# Patient Record
Sex: Female | Born: 1970 | Race: White | Hispanic: No | Marital: Married
Health system: Southern US, Community
[De-identification: ages and names within clinical notes are randomized; demographics above are authoritative.]

## PROBLEM LIST (undated history)

## (undated) HISTORY — PX: BREAST CYST ASPIRATION: SHX578

---

## 2010-03-31 ENCOUNTER — Encounter: Admission: RE | Admit: 2010-03-31 | Discharge: 2010-03-31 | Payer: Self-pay | Admitting: Obstetrics and Gynecology

## 2011-03-02 ENCOUNTER — Other Ambulatory Visit: Payer: Self-pay | Admitting: Obstetrics and Gynecology

## 2011-03-09 ENCOUNTER — Ambulatory Visit
Admission: RE | Admit: 2011-03-09 | Discharge: 2011-03-09 | Disposition: A | Discharge status: Home / Self Care | Payer: BC Managed Care – PPO | Source: Ambulatory Visit | Attending: Obstetrics and Gynecology | Admitting: Obstetrics and Gynecology

## 2011-03-12 ENCOUNTER — Other Ambulatory Visit: Payer: Self-pay | Admitting: Obstetrics and Gynecology

## 2011-03-12 DIAGNOSIS — N6320 Unspecified lump in the left breast, unspecified quadrant: Secondary | ICD-10-CM

## 2011-03-16 ENCOUNTER — Ambulatory Visit
Admission: RE | Admit: 2011-03-16 | Discharge: 2011-03-16 | Disposition: A | Discharge status: Home / Self Care | Payer: BC Managed Care – PPO | Source: Ambulatory Visit | Attending: Obstetrics and Gynecology | Admitting: Obstetrics and Gynecology

## 2011-03-16 DIAGNOSIS — N6320 Unspecified lump in the left breast, unspecified quadrant: Secondary | ICD-10-CM

## 2011-08-27 ENCOUNTER — Other Ambulatory Visit: Payer: Self-pay | Admitting: Obstetrics and Gynecology

## 2011-08-27 DIAGNOSIS — N6009 Solitary cyst of unspecified breast: Secondary | ICD-10-CM

## 2011-09-10 ENCOUNTER — Ambulatory Visit
Admission: RE | Admit: 2011-09-10 | Discharge: 2011-09-10 | Disposition: A | Discharge status: Home / Self Care | Payer: BC Managed Care – PPO | Source: Ambulatory Visit | Attending: Obstetrics and Gynecology | Admitting: Obstetrics and Gynecology

## 2011-09-10 DIAGNOSIS — N6009 Solitary cyst of unspecified breast: Secondary | ICD-10-CM

## 2012-11-10 ENCOUNTER — Other Ambulatory Visit: Payer: Self-pay | Admitting: Obstetrics and Gynecology

## 2012-11-10 DIAGNOSIS — N6323 Unspecified lump in the left breast, lower outer quadrant: Secondary | ICD-10-CM

## 2012-11-13 ENCOUNTER — Ambulatory Visit
Admission: RE | Admit: 2012-11-13 | Discharge: 2012-11-13 | Disposition: A | Discharge status: Home / Self Care | Payer: 59 | Source: Ambulatory Visit | Attending: Obstetrics and Gynecology | Admitting: Obstetrics and Gynecology

## 2012-11-13 DIAGNOSIS — N6323 Unspecified lump in the left breast, lower outer quadrant: Secondary | ICD-10-CM

## 2014-06-07 ENCOUNTER — Other Ambulatory Visit: Payer: Self-pay | Admitting: Obstetrics and Gynecology

## 2014-06-07 DIAGNOSIS — N6002 Solitary cyst of left breast: Secondary | ICD-10-CM

## 2014-06-08 ENCOUNTER — Other Ambulatory Visit: Payer: Self-pay | Admitting: Obstetrics and Gynecology

## 2014-06-08 DIAGNOSIS — N6002 Solitary cyst of left breast: Secondary | ICD-10-CM

## 2014-06-15 ENCOUNTER — Ambulatory Visit
Admission: RE | Admit: 2014-06-15 | Discharge: 2014-06-15 | Disposition: A | Discharge status: Home / Self Care | Payer: 59 | Source: Ambulatory Visit | Attending: Obstetrics and Gynecology | Admitting: Obstetrics and Gynecology

## 2014-06-15 ENCOUNTER — Encounter (INDEPENDENT_AMBULATORY_CARE_PROVIDER_SITE_OTHER): Payer: Self-pay

## 2014-06-15 DIAGNOSIS — N6002 Solitary cyst of left breast: Secondary | ICD-10-CM

## 2015-08-23 ENCOUNTER — Other Ambulatory Visit: Payer: Self-pay | Admitting: Obstetrics and Gynecology

## 2015-08-23 DIAGNOSIS — N63 Unspecified lump in unspecified breast: Secondary | ICD-10-CM

## 2015-08-30 ENCOUNTER — Other Ambulatory Visit: Payer: Self-pay

## 2015-09-07 ENCOUNTER — Ambulatory Visit
Admission: RE | Admit: 2015-09-07 | Discharge: 2015-09-07 | Disposition: A | Discharge status: Home / Self Care | Payer: Managed Care, Other (non HMO) | Source: Ambulatory Visit | Attending: Obstetrics and Gynecology | Admitting: Obstetrics and Gynecology

## 2015-09-07 DIAGNOSIS — N63 Unspecified lump in unspecified breast: Secondary | ICD-10-CM

## 2016-05-08 ENCOUNTER — Ambulatory Visit
Admission: RE | Admit: 2016-05-08 | Discharge: 2016-05-08 | Disposition: A | Discharge status: Home / Self Care | Payer: Managed Care, Other (non HMO) | Source: Ambulatory Visit | Attending: Obstetrics and Gynecology | Admitting: Obstetrics and Gynecology

## 2016-05-08 ENCOUNTER — Other Ambulatory Visit: Payer: Self-pay | Admitting: Obstetrics and Gynecology

## 2016-05-08 DIAGNOSIS — N63 Unspecified lump in unspecified breast: Secondary | ICD-10-CM

## 2017-06-07 ENCOUNTER — Other Ambulatory Visit: Payer: Self-pay | Admitting: Obstetrics and Gynecology

## 2017-06-07 DIAGNOSIS — Z1231 Encounter for screening mammogram for malignant neoplasm of breast: Secondary | ICD-10-CM

## 2017-06-12 ENCOUNTER — Ambulatory Visit
Admission: RE | Admit: 2017-06-12 | Discharge: 2017-06-12 | Disposition: A | Discharge status: Home / Self Care | Payer: 59 | Source: Ambulatory Visit | Attending: Obstetrics and Gynecology | Admitting: Obstetrics and Gynecology

## 2017-06-12 DIAGNOSIS — Z1231 Encounter for screening mammogram for malignant neoplasm of breast: Secondary | ICD-10-CM

## 2018-01-20 ENCOUNTER — Emergency Department (HOSPITAL_COMMUNITY)
Admission: EM | Admit: 2018-01-20 | Discharge: 2018-01-20 | Disposition: A | Discharge status: Home / Self Care | Payer: 59 | Attending: Emergency Medicine | Admitting: Emergency Medicine

## 2018-01-20 ENCOUNTER — Emergency Department (HOSPITAL_COMMUNITY): Payer: 59

## 2018-01-20 ENCOUNTER — Other Ambulatory Visit: Payer: Self-pay

## 2018-01-20 DIAGNOSIS — M5412 Radiculopathy, cervical region: Secondary | ICD-10-CM | POA: Diagnosis not present

## 2018-01-20 DIAGNOSIS — M79602 Pain in left arm: Secondary | ICD-10-CM | POA: Diagnosis present

## 2018-01-20 LAB — CBC
HCT: 40 % (ref 36.0–46.0)
HEMOGLOBIN: 13.2 g/dL (ref 12.0–15.0)
MCH: 29.5 pg (ref 26.0–34.0)
MCHC: 33 g/dL (ref 30.0–36.0)
MCV: 89.5 fL (ref 78.0–100.0)
Platelets: 196 10*3/uL (ref 150–400)
RBC: 4.47 MIL/uL (ref 3.87–5.11)
RDW: 13.5 % (ref 11.5–15.5)
WBC: 3.8 10*3/uL — AB (ref 4.0–10.5)

## 2018-01-20 LAB — BASIC METABOLIC PANEL
ANION GAP: 8 (ref 5–15)
BUN: 13 mg/dL (ref 6–20)
CALCIUM: 9 mg/dL (ref 8.9–10.3)
CHLORIDE: 106 mmol/L (ref 101–111)
CO2: 24 mmol/L (ref 22–32)
Creatinine, Ser: 0.68 mg/dL (ref 0.44–1.00)
GFR calc non Af Amer: >60 mL/min (ref >60)
Glucose, Bld: 108 mg/dL — ABNORMAL HIGH (ref 65–99)
Potassium: 4.3 mmol/L (ref 3.5–5.1)
SODIUM: 138 mmol/L (ref 135–145)

## 2018-01-20 LAB — I-STAT TROPONIN, ED
TROPONIN I, POC: 0 ng/mL (ref 0.00–0.08)
Troponin i, poc: 0 ng/mL (ref 0.00–0.08)

## 2018-01-20 LAB — I-STAT BETA HCG BLOOD, ED (MC, WL, AP ONLY)

## 2018-01-20 NOTE — ED Triage Notes (Signed)
Pt states last night she developed left side arm pain in her elbow that radiates into her shoulder. Pt states this morning she also began to have tightness in her chest. Pt denies any sob.

## 2018-01-20 NOTE — ED Provider Notes (Signed)
[facility] EMERGENCY DEPARTMENT Provider Note   CSN: [id number] Arrival date & time: 01/20/18  0946   History   Chief Complaint Chief Complaint  Patient presents with  . Shoulder Pain    HPI Consuello Lassalle is a 47 y.o. female.  HPI   47 year old female presents today with complaints of left arm pain.  Patient notes that last night she developed some soreness and pain in her left trapezius and arm.  She notes some paresthesias including tingling in the left hand diffusely.  She denies any acute injury but notes she is been exercising.  She notes the symptoms are coming and going, worse with movement of the shoulder.  She denies any acute weakness in the hand, denies any other neurological deficits here today.  Patient denies any personal cardiac history, and there is no cardiac history of her father with MI.  She reports no hypertension, hyperlipidemia, diabetes, smoking, or any other significant risk factors for ACS.  Patient denies any chest pain shortness of breath, diaphoresis here today.  Patient attempted to see her primary care who referred her to the emergency room.    No past medical history on file.  There are no active problems to display for this patient.   Past Surgical History:  Procedure Laterality Date  . BREAST CYST ASPIRATION Left      OB History   None      Home Medications    Prior to Admission medications   Not on File    Family History No family history on file.  Social History Social History   Tobacco Use  . Smoking status: Not on file  Substance Use Topics  . Alcohol use: Not on file  . Drug use: Not on file     Allergies   Ciprofloxacin   Review of Systems Review of Systems  All other systems reviewed and are negative.  Physical Exam Updated Vital Signs BP 115/78 (BP Location: Right Arm)   Pulse 70   Temp 98.6 F (37 C) (Oral)   Resp 20   LMP 12/31/2017 Comment: mirena  SpO2 99%   Physical Exam    Constitutional: She is oriented to person, place, and time. She appears well-developed and well-nourished.  HENT:  Head: Normocephalic and atraumatic.  Eyes: Pupils are equal, round, and reactive to light. Conjunctivae are normal. Right eye exhibits no discharge. Left eye exhibits no discharge. No scleral icterus.  Neck: Normal range of motion. No JVD present. No tracheal deviation present.  Pulmonary/Chest: Effort normal. No stridor.  Neurological: She is alert and oriented to person, place, and time. Coordination normal.  Psychiatric: She has a normal mood and affect. Her behavior is normal. Judgment and thought content normal.  Nursing note and vitals reviewed.   ED Treatments / Results  Labs (all labs ordered are listed, but only abnormal results are displayed) Labs Reviewed  BASIC METABOLIC PANEL - Abnormal; Notable for the following components:      Result Value   Glucose, Bld 108 (*)    All other components within normal limits  CBC - Abnormal; Notable for the following components:   WBC 3.8 (*)    All other components within normal limits  I-STAT TROPONIN, ED  I-STAT BETA HCG BLOOD, ED ([facility], [facility], [facility] ONLY)  I-STAT TROPONIN, ED    EKG EKG Interpretation  Date/Time:  Monday January 20 2018 09:55:59 EDT Ventricular Rate:  81 PR Interval:  146 QRS Duration: 76 QT Interval:  384  QTC Calculation: 446 R Axis:   0 Text Interpretation:  Normal sinus rhythm Low voltage QRS no ST changes. normal. no old comparison Confirmed by Arby BarrettePfeiffer, Marcy [id number]) on 01/20/2018 1:52:09 PM   Radiology Dg Chest 2 View  Result Date: 01/20/2018 CLINICAL DATA:  LEFT-sided chest tightness.  Sweats overnight. EXAM: CHEST - 2 VIEW COMPARISON:  None. FINDINGS: The heart size and mediastinal contours are within normal limits. Both lungs are clear. The visualized skeletal structures are unremarkable. IMPRESSION: No active cardiopulmonary disease. Electronically Signed   By: Elsie StainJohn T Curnes M.D.   On:  01/20/2018 10:38    Procedures Procedures (including critical care time)  Medications Ordered in ED Medications - No data to display   Initial Impression / Assessment and Plan / ED Course  I have reviewed the triage vital signs and the nursing notes.  Pertinent labs & imaging results that were available during my care of the patient were reviewed by me and considered in my medical decision making (see chart for details).     Final Clinical Impressions(s) / ED Diagnoses   Final diagnoses:  Cervical radiculopathy    Labs: I-STAT troponin x2, i-STAT beta-hCG, BMP, CBC  Imaging: DG chest 2 view, ED EKG-no acute abnormalities noted  Consults:  Therapeutics:  Discharge Meds:   Assessment/Plan: 47 year old female presents with paresthesias in her left arm.  She has no signs of ACS dissection or any other intrathoracic abnormality.  She has reassuring workup here with negative troponin reassuring EKG negative chest x-ray.  Her symptoms are reproducible on exam with likely cervical radiculopathy.  Patient will use symptomatic means including ibuprofen, rest, stretching and follow-up as an outpatient if symptoms persist or return to emergency room if she worsens.  Patient without any signs of acute intracranial abnormality including stroke, TIA.  Strict return precautions given, she verbalized understanding and agreement to today's plan had no further questions or concerns at the time of discharge.   ED Discharge Orders    None       Rosalio LoudHedges, Tyyonna Soucy, PA-C 01/20/18 1402    Arby BarrettePfeiffer, Marcy, MD 01/20/18 1810

## 2018-01-20 NOTE — Discharge Instructions (Addendum)
Please read attached information. If you experience any new or worsening signs or symptoms please return to the emergency room for evaluation. Please follow-up with your primary care provider or specialist as discussed.  °

## 2018-01-20 NOTE — ED Notes (Signed)
Pt requesting to leave and just follow up with her PCP, This RN request pt to please try and stay to see a provider.

## 2018-08-02 ENCOUNTER — Other Ambulatory Visit: Payer: Self-pay | Admitting: Family Medicine

## 2018-08-02 DIAGNOSIS — E049 Nontoxic goiter, unspecified: Secondary | ICD-10-CM

## 2018-08-15 ENCOUNTER — Ambulatory Visit
Admission: RE | Admit: 2018-08-15 | Discharge: 2018-08-15 | Disposition: A | Discharge status: Home / Self Care | Payer: 59 | Source: Ambulatory Visit | Attending: Family Medicine | Admitting: Family Medicine

## 2018-08-15 DIAGNOSIS — E049 Nontoxic goiter, unspecified: Secondary | ICD-10-CM

## 2018-08-20 ENCOUNTER — Other Ambulatory Visit: Payer: Self-pay | Admitting: Family Medicine

## 2018-08-20 DIAGNOSIS — E041 Nontoxic single thyroid nodule: Secondary | ICD-10-CM

## 2018-09-09 ENCOUNTER — Ambulatory Visit
Admission: RE | Admit: 2018-09-09 | Discharge: 2018-09-09 | Disposition: A | Discharge status: Home / Self Care | Payer: 59 | Source: Ambulatory Visit | Attending: Family Medicine | Admitting: Family Medicine

## 2018-09-09 ENCOUNTER — Other Ambulatory Visit (HOSPITAL_COMMUNITY)
Admission: RE | Admit: 2018-09-09 | Discharge: 2018-09-09 | Disposition: A | Discharge status: Home / Self Care | Payer: 59 | Source: Ambulatory Visit | Attending: Diagnostic Radiology | Admitting: Diagnostic Radiology

## 2018-09-09 DIAGNOSIS — E041 Nontoxic single thyroid nodule: Secondary | ICD-10-CM

## 2018-10-06 MED FILL — OXYCODONE-ACETAMINOPHEN 5-3: 5-325 | 5 days supply | Qty: 30 | Fill #0

## 2019-04-09 ENCOUNTER — Encounter: Payer: Self-pay | Admitting: Family Medicine

## 2019-10-21 ENCOUNTER — Other Ambulatory Visit: Payer: Self-pay | Admitting: Radiology

## 2019-10-21 DIAGNOSIS — N6320 Unspecified lump in the left breast, unspecified quadrant: Secondary | ICD-10-CM

## 2019-10-29 ENCOUNTER — Ambulatory Visit
Admission: RE | Admit: 2019-10-29 | Discharge: 2019-10-29 | Disposition: A | Discharge status: Home / Self Care | Payer: 59 | Source: Ambulatory Visit | Attending: Radiology | Admitting: Radiology

## 2019-10-29 ENCOUNTER — Ambulatory Visit
Admission: RE | Admit: 2019-10-29 | Discharge: 2019-10-29 | Disposition: A | Discharge status: Home / Self Care | Payer: Managed Care, Other (non HMO) | Source: Ambulatory Visit | Attending: Radiology | Admitting: Radiology

## 2019-10-29 ENCOUNTER — Other Ambulatory Visit: Payer: Self-pay | Admitting: Radiology

## 2019-10-29 ENCOUNTER — Other Ambulatory Visit: Payer: Self-pay

## 2019-10-29 DIAGNOSIS — N6320 Unspecified lump in the left breast, unspecified quadrant: Secondary | ICD-10-CM

## 2019-10-29 DIAGNOSIS — N6310 Unspecified lump in the right breast, unspecified quadrant: Secondary | ICD-10-CM

## 2019-11-13 ENCOUNTER — Other Ambulatory Visit: Payer: Self-pay | Admitting: Radiology

## 2019-11-13 DIAGNOSIS — N6002 Solitary cyst of left breast: Secondary | ICD-10-CM

## 2019-12-04 ENCOUNTER — Other Ambulatory Visit: Payer: Managed Care, Other (non HMO)

## 2019-12-04 ENCOUNTER — Ambulatory Visit
Admission: RE | Admit: 2019-12-04 | Discharge: 2019-12-04 | Disposition: A | Discharge status: Home / Self Care | Payer: Managed Care, Other (non HMO) | Source: Ambulatory Visit | Attending: Radiology | Admitting: Radiology

## 2019-12-04 ENCOUNTER — Other Ambulatory Visit: Payer: Self-pay

## 2019-12-04 DIAGNOSIS — N6002 Solitary cyst of left breast: Secondary | ICD-10-CM

## 2019-12-09 ENCOUNTER — Ambulatory Visit
Admission: RE | Admit: 2019-12-09 | Discharge: 2019-12-09 | Disposition: A | Discharge status: Home / Self Care | Payer: Managed Care, Other (non HMO) | Source: Ambulatory Visit | Attending: Radiology | Admitting: Radiology

## 2019-12-09 ENCOUNTER — Other Ambulatory Visit: Payer: Self-pay

## 2019-12-09 DIAGNOSIS — N6002 Solitary cyst of left breast: Secondary | ICD-10-CM

## 2020-10-08 HISTORY — PX: BREAST BIOPSY: SHX20

## 2021-06-02 ENCOUNTER — Other Ambulatory Visit: Payer: Self-pay | Admitting: Radiology

## 2021-06-02 DIAGNOSIS — N6320 Unspecified lump in the left breast, unspecified quadrant: Secondary | ICD-10-CM

## 2021-06-05 ENCOUNTER — Other Ambulatory Visit: Payer: Self-pay

## 2021-06-05 ENCOUNTER — Other Ambulatory Visit: Payer: Self-pay | Admitting: Radiology

## 2021-06-05 ENCOUNTER — Ambulatory Visit
Admission: RE | Admit: 2021-06-05 | Discharge: 2021-06-05 | Disposition: A | Discharge status: Home / Self Care | Payer: 59 | Source: Ambulatory Visit | Attending: Radiology | Admitting: Radiology

## 2021-06-05 ENCOUNTER — Ambulatory Visit
Admission: RE | Admit: 2021-06-05 | Discharge: 2021-06-05 | Disposition: A | Discharge status: Home / Self Care | Payer: Managed Care, Other (non HMO) | Source: Ambulatory Visit | Attending: Radiology | Admitting: Radiology

## 2021-06-05 DIAGNOSIS — N6320 Unspecified lump in the left breast, unspecified quadrant: Secondary | ICD-10-CM

## 2021-06-09 ENCOUNTER — Other Ambulatory Visit: Payer: Self-pay | Admitting: Radiology

## 2021-06-09 ENCOUNTER — Other Ambulatory Visit: Payer: Managed Care, Other (non HMO)

## 2021-06-09 DIAGNOSIS — R921 Mammographic calcification found on diagnostic imaging of breast: Secondary | ICD-10-CM

## 2021-06-15 ENCOUNTER — Other Ambulatory Visit: Payer: Self-pay

## 2021-06-15 ENCOUNTER — Ambulatory Visit
Admission: RE | Admit: 2021-06-15 | Discharge: 2021-06-15 | Disposition: A | Discharge status: Home / Self Care | Payer: 59 | Source: Ambulatory Visit | Attending: Radiology | Admitting: Radiology

## 2021-06-15 ENCOUNTER — Other Ambulatory Visit: Payer: Self-pay | Admitting: Radiology

## 2021-06-15 DIAGNOSIS — R921 Mammographic calcification found on diagnostic imaging of breast: Secondary | ICD-10-CM

## 2021-12-27 DIAGNOSIS — H02831 Dermatochalasis of right upper eyelid: Secondary | ICD-10-CM | POA: Diagnosis not present

## 2021-12-27 DIAGNOSIS — H0289 Other specified disorders of eyelid: Secondary | ICD-10-CM | POA: Diagnosis not present

## 2021-12-27 DIAGNOSIS — H02834 Dermatochalasis of left upper eyelid: Secondary | ICD-10-CM | POA: Diagnosis not present

## 2022-01-04 DIAGNOSIS — F411 Generalized anxiety disorder: Secondary | ICD-10-CM | POA: Diagnosis not present

## 2022-01-04 DIAGNOSIS — F429 Obsessive-compulsive disorder, unspecified: Secondary | ICD-10-CM | POA: Diagnosis not present

## 2022-01-04 DIAGNOSIS — F331 Major depressive disorder, recurrent, moderate: Secondary | ICD-10-CM | POA: Diagnosis not present

## 2022-01-04 DIAGNOSIS — G4700 Insomnia, unspecified: Secondary | ICD-10-CM | POA: Diagnosis not present

## 2022-01-11 DIAGNOSIS — G4700 Insomnia, unspecified: Secondary | ICD-10-CM | POA: Diagnosis not present

## 2022-01-11 DIAGNOSIS — F411 Generalized anxiety disorder: Secondary | ICD-10-CM | POA: Diagnosis not present

## 2022-01-11 DIAGNOSIS — F331 Major depressive disorder, recurrent, moderate: Secondary | ICD-10-CM | POA: Diagnosis not present

## 2022-02-01 DIAGNOSIS — F331 Major depressive disorder, recurrent, moderate: Secondary | ICD-10-CM | POA: Diagnosis not present

## 2022-02-01 DIAGNOSIS — R42 Dizziness and giddiness: Secondary | ICD-10-CM | POA: Diagnosis not present

## 2022-02-01 DIAGNOSIS — F411 Generalized anxiety disorder: Secondary | ICD-10-CM | POA: Diagnosis not present

## 2022-02-01 DIAGNOSIS — G4700 Insomnia, unspecified: Secondary | ICD-10-CM | POA: Diagnosis not present

## 2022-02-06 DIAGNOSIS — Z01419 Encounter for gynecological examination (general) (routine) without abnormal findings: Secondary | ICD-10-CM | POA: Diagnosis not present

## 2022-02-06 DIAGNOSIS — Z681 Body mass index (BMI) 19 or less, adult: Secondary | ICD-10-CM | POA: Diagnosis not present

## 2022-02-06 DIAGNOSIS — N912 Amenorrhea, unspecified: Secondary | ICD-10-CM | POA: Diagnosis not present

## 2022-02-27 DIAGNOSIS — F331 Major depressive disorder, recurrent, moderate: Secondary | ICD-10-CM | POA: Diagnosis not present

## 2022-02-27 DIAGNOSIS — F411 Generalized anxiety disorder: Secondary | ICD-10-CM | POA: Diagnosis not present

## 2022-02-27 DIAGNOSIS — G4700 Insomnia, unspecified: Secondary | ICD-10-CM | POA: Diagnosis not present

## 2022-03-16 DIAGNOSIS — G4700 Insomnia, unspecified: Secondary | ICD-10-CM | POA: Diagnosis not present

## 2022-03-16 DIAGNOSIS — F429 Obsessive-compulsive disorder, unspecified: Secondary | ICD-10-CM | POA: Diagnosis not present

## 2022-03-16 DIAGNOSIS — F411 Generalized anxiety disorder: Secondary | ICD-10-CM | POA: Diagnosis not present

## 2022-03-16 DIAGNOSIS — F331 Major depressive disorder, recurrent, moderate: Secondary | ICD-10-CM | POA: Diagnosis not present

## 2022-03-28 DIAGNOSIS — F411 Generalized anxiety disorder: Secondary | ICD-10-CM | POA: Diagnosis not present

## 2022-03-28 DIAGNOSIS — F988 Other specified behavioral and emotional disorders with onset usually occurring in childhood and adolescence: Secondary | ICD-10-CM | POA: Diagnosis not present

## 2022-03-28 DIAGNOSIS — G4700 Insomnia, unspecified: Secondary | ICD-10-CM | POA: Diagnosis not present

## 2022-03-28 DIAGNOSIS — F331 Major depressive disorder, recurrent, moderate: Secondary | ICD-10-CM | POA: Diagnosis not present

## 2022-04-19 DIAGNOSIS — F331 Major depressive disorder, recurrent, moderate: Secondary | ICD-10-CM | POA: Diagnosis not present

## 2022-04-19 DIAGNOSIS — G4700 Insomnia, unspecified: Secondary | ICD-10-CM | POA: Diagnosis not present

## 2022-04-19 DIAGNOSIS — F988 Other specified behavioral and emotional disorders with onset usually occurring in childhood and adolescence: Secondary | ICD-10-CM | POA: Diagnosis not present

## 2022-04-19 DIAGNOSIS — F411 Generalized anxiety disorder: Secondary | ICD-10-CM | POA: Diagnosis not present

## 2022-05-03 DIAGNOSIS — F331 Major depressive disorder, recurrent, moderate: Secondary | ICD-10-CM | POA: Diagnosis not present

## 2022-05-03 DIAGNOSIS — F411 Generalized anxiety disorder: Secondary | ICD-10-CM | POA: Diagnosis not present

## 2022-05-03 DIAGNOSIS — F988 Other specified behavioral and emotional disorders with onset usually occurring in childhood and adolescence: Secondary | ICD-10-CM | POA: Diagnosis not present

## 2022-05-03 DIAGNOSIS — G4700 Insomnia, unspecified: Secondary | ICD-10-CM | POA: Diagnosis not present

## 2022-05-24 DIAGNOSIS — F988 Other specified behavioral and emotional disorders with onset usually occurring in childhood and adolescence: Secondary | ICD-10-CM | POA: Diagnosis not present

## 2022-06-06 DIAGNOSIS — R42 Dizziness and giddiness: Secondary | ICD-10-CM | POA: Diagnosis not present

## 2022-06-06 DIAGNOSIS — F411 Generalized anxiety disorder: Secondary | ICD-10-CM | POA: Diagnosis not present

## 2022-06-06 DIAGNOSIS — F988 Other specified behavioral and emotional disorders with onset usually occurring in childhood and adolescence: Secondary | ICD-10-CM | POA: Diagnosis not present

## 2022-06-26 DIAGNOSIS — R140 Abdominal distension (gaseous): Secondary | ICD-10-CM | POA: Diagnosis not present

## 2022-06-26 DIAGNOSIS — Z30432 Encounter for removal of intrauterine contraceptive device: Secondary | ICD-10-CM | POA: Diagnosis not present

## 2022-06-26 DIAGNOSIS — R102 Pelvic and perineal pain: Secondary | ICD-10-CM | POA: Diagnosis not present

## 2022-06-26 DIAGNOSIS — N950 Postmenopausal bleeding: Secondary | ICD-10-CM | POA: Diagnosis not present

## 2022-06-28 DIAGNOSIS — G4700 Insomnia, unspecified: Secondary | ICD-10-CM | POA: Diagnosis not present

## 2022-06-28 DIAGNOSIS — F988 Other specified behavioral and emotional disorders with onset usually occurring in childhood and adolescence: Secondary | ICD-10-CM | POA: Diagnosis not present

## 2022-06-28 DIAGNOSIS — Z23 Encounter for immunization: Secondary | ICD-10-CM | POA: Diagnosis not present

## 2022-06-28 DIAGNOSIS — F411 Generalized anxiety disorder: Secondary | ICD-10-CM | POA: Diagnosis not present

## 2022-06-28 DIAGNOSIS — F331 Major depressive disorder, recurrent, moderate: Secondary | ICD-10-CM | POA: Diagnosis not present

## 2022-07-16 DIAGNOSIS — R140 Abdominal distension (gaseous): Secondary | ICD-10-CM | POA: Diagnosis not present

## 2022-07-20 DIAGNOSIS — M5451 Vertebrogenic low back pain: Secondary | ICD-10-CM | POA: Diagnosis not present

## 2022-09-04 DIAGNOSIS — Z681 Body mass index (BMI) 19 or less, adult: Secondary | ICD-10-CM | POA: Diagnosis not present

## 2022-09-04 DIAGNOSIS — F331 Major depressive disorder, recurrent, moderate: Secondary | ICD-10-CM | POA: Diagnosis not present

## 2022-09-04 DIAGNOSIS — L821 Other seborrheic keratosis: Secondary | ICD-10-CM | POA: Diagnosis not present

## 2022-09-04 DIAGNOSIS — L918 Other hypertrophic disorders of the skin: Secondary | ICD-10-CM | POA: Diagnosis not present

## 2022-09-04 DIAGNOSIS — F411 Generalized anxiety disorder: Secondary | ICD-10-CM | POA: Diagnosis not present

## 2022-09-04 DIAGNOSIS — G4700 Insomnia, unspecified: Secondary | ICD-10-CM | POA: Diagnosis not present

## 2022-09-04 DIAGNOSIS — D1801 Hemangioma of skin and subcutaneous tissue: Secondary | ICD-10-CM | POA: Diagnosis not present

## 2022-09-24 DIAGNOSIS — Z13220 Encounter for screening for lipoid disorders: Secondary | ICD-10-CM | POA: Diagnosis not present

## 2022-09-24 DIAGNOSIS — Z0000 Encounter for general adult medical examination without abnormal findings: Secondary | ICD-10-CM | POA: Diagnosis not present

## 2022-09-24 DIAGNOSIS — Z114 Encounter for screening for human immunodeficiency virus [HIV]: Secondary | ICD-10-CM | POA: Diagnosis not present

## 2022-09-27 DIAGNOSIS — Z0000 Encounter for general adult medical examination without abnormal findings: Secondary | ICD-10-CM | POA: Diagnosis not present

## 2023-02-08 DIAGNOSIS — Z6820 Body mass index (BMI) 20.0-20.9, adult: Secondary | ICD-10-CM | POA: Diagnosis not present

## 2023-02-08 DIAGNOSIS — Z01419 Encounter for gynecological examination (general) (routine) without abnormal findings: Secondary | ICD-10-CM | POA: Diagnosis not present

## 2023-02-08 DIAGNOSIS — N951 Menopausal and female climacteric states: Secondary | ICD-10-CM | POA: Diagnosis not present

## 2023-02-08 DIAGNOSIS — R6882 Decreased libido: Secondary | ICD-10-CM | POA: Diagnosis not present

## 2023-02-21 ENCOUNTER — Ambulatory Visit
Admission: RE | Admit: 2023-02-21 | Discharge: 2023-02-21 | Disposition: A | Discharge status: Home / Self Care | Payer: BC Managed Care – PPO | Source: Ambulatory Visit | Attending: Obstetrics and Gynecology | Admitting: Obstetrics and Gynecology

## 2023-02-21 ENCOUNTER — Other Ambulatory Visit: Payer: Self-pay | Admitting: Obstetrics and Gynecology

## 2023-02-21 DIAGNOSIS — Z1231 Encounter for screening mammogram for malignant neoplasm of breast: Secondary | ICD-10-CM | POA: Diagnosis not present

## 2023-02-28 DIAGNOSIS — R6882 Decreased libido: Secondary | ICD-10-CM | POA: Diagnosis not present

## 2023-04-28 IMAGING — MG MM BREAST BX W/ LOC DEV 1ST LESION IMAGE BX SPEC STEREO GUIDE*R*
8 of 12 series · 8 of 28 positions shown · non-contrast
Comparison: Previous exams.
COMPARISON: Previous exams.

Addendum:
CLINICAL DATA: Patient presents for stereotactic guided core biopsy
of RIGHT breast calcifications.

EXAM:
RIGHT BREAST STEREOTACTIC CORE NEEDLE BIOPSY

[R (1 of 6)]
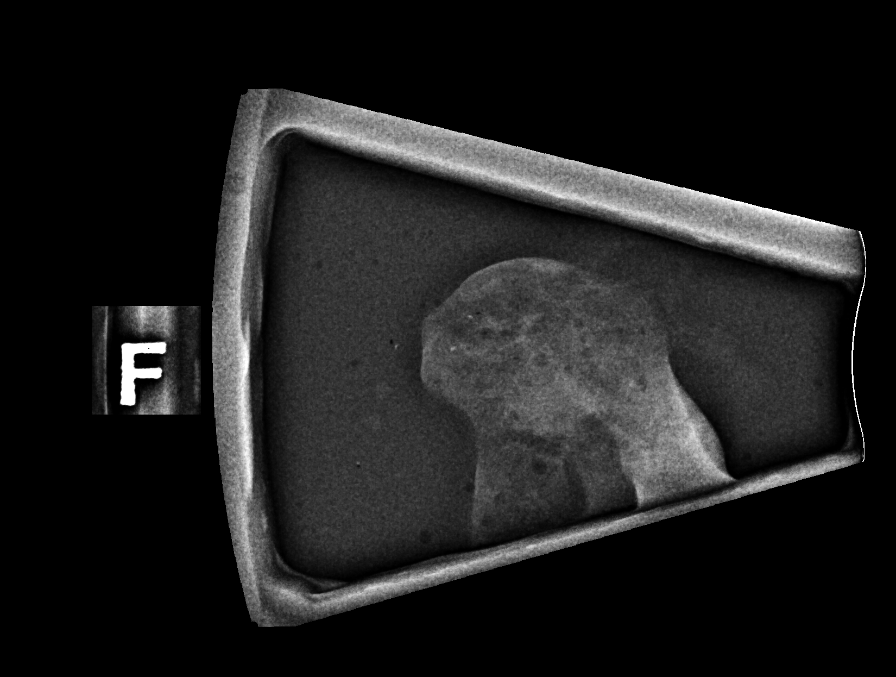

[R (2 of 6)]
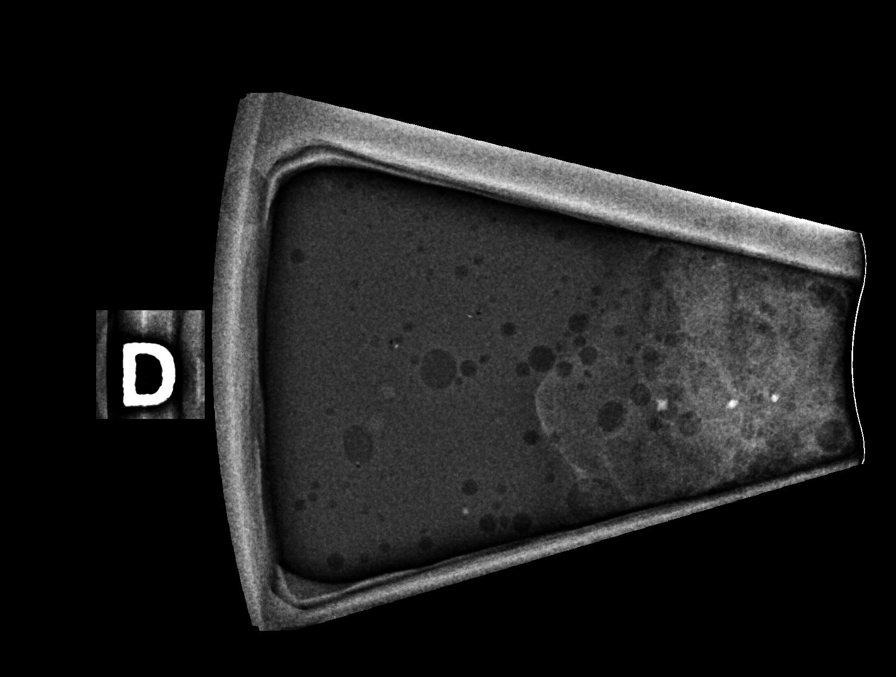

[R (3 of 6)]
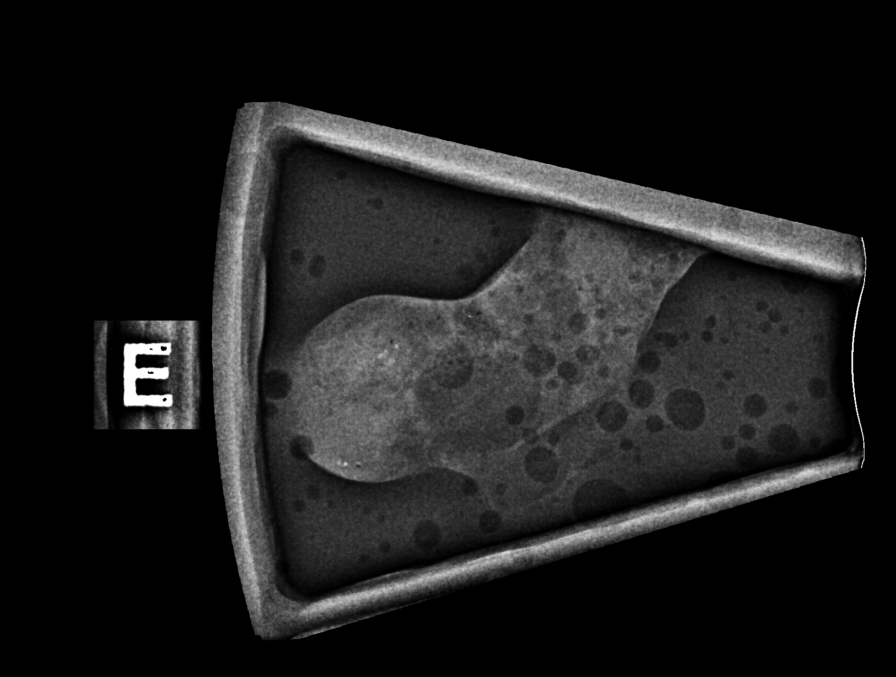

[R (4 of 6)]
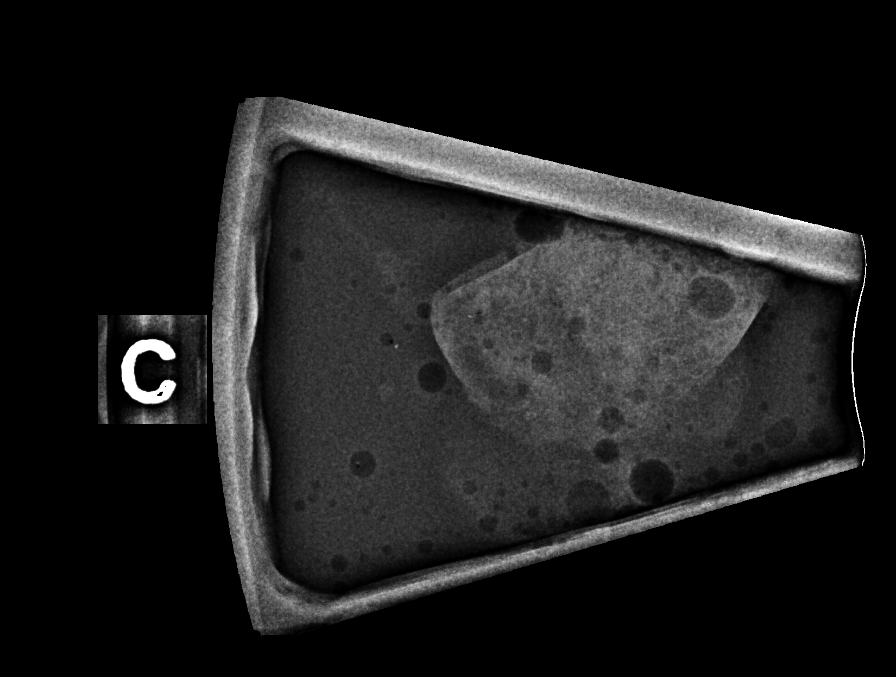

[R (5 of 6)]
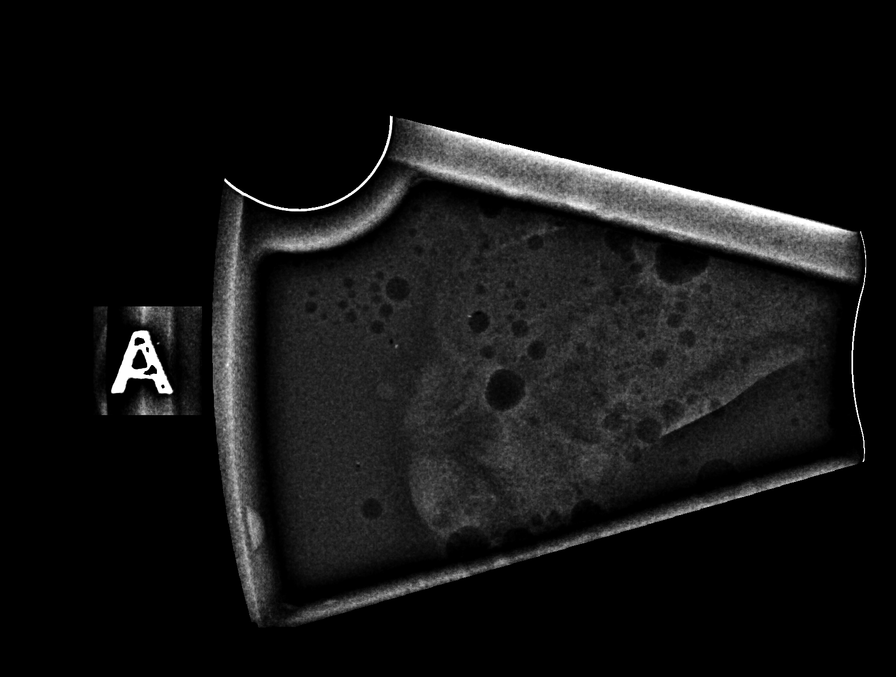

[R (6 of 6)]
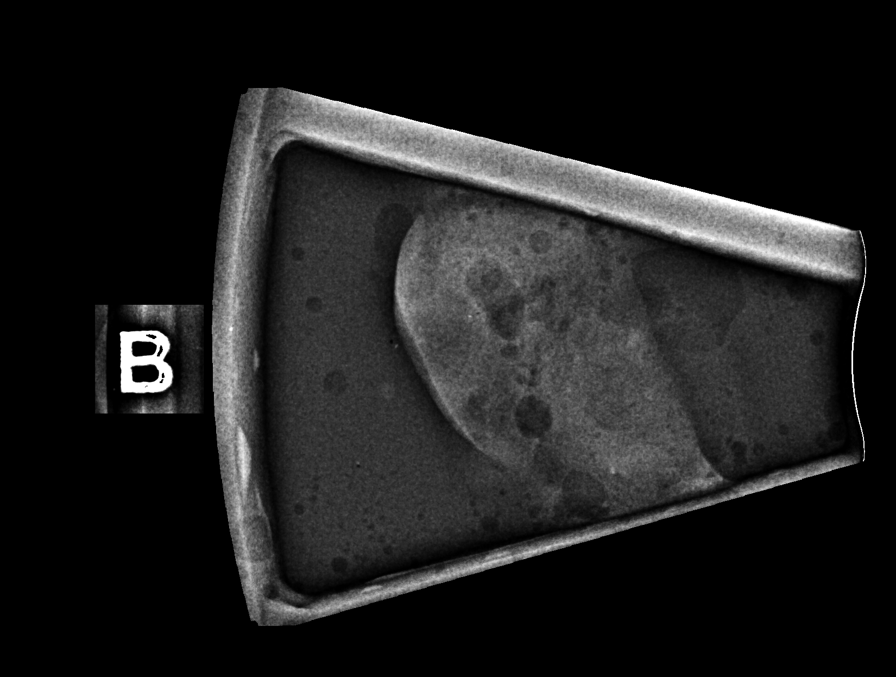

[R LM (1 of 2)]
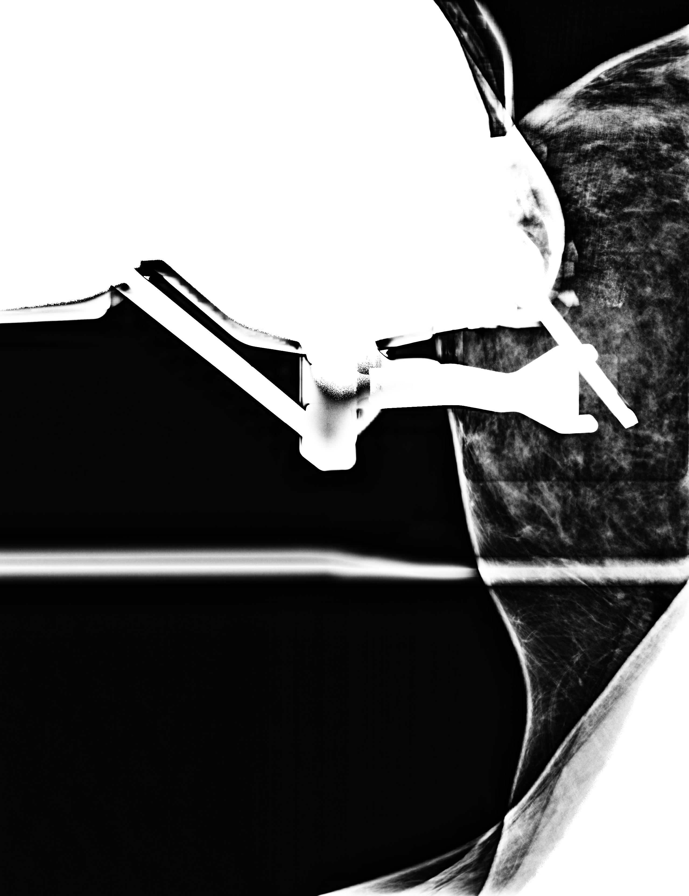

[R LM (2 of 2)]
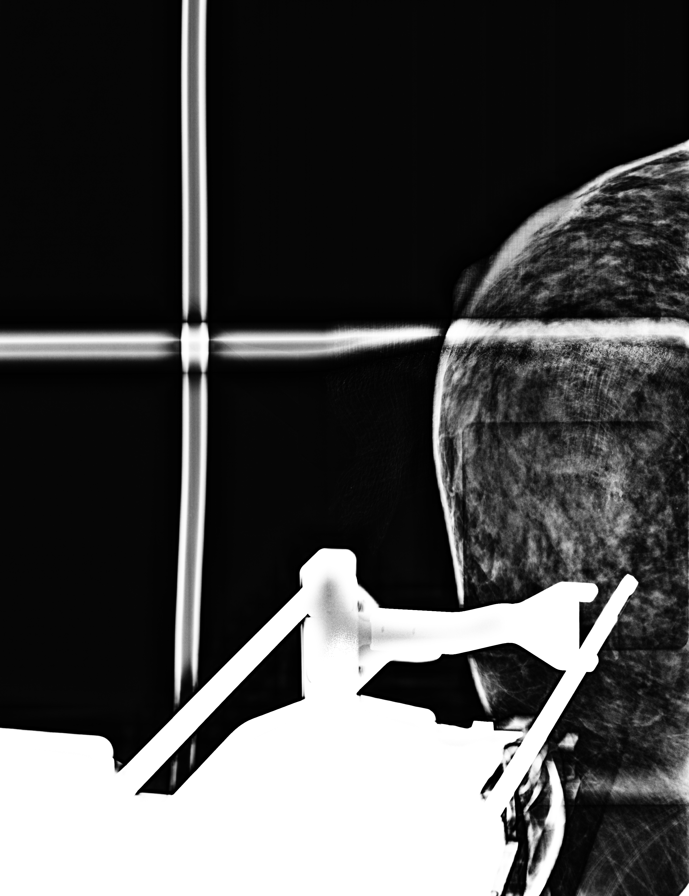

[8 of 28 positions shown; findings below may reference images not displayed]



Using sterile technique and 1% Lidocaine as local anesthetic, under
stereotactic guidance, a 9 gauge vacuum assisted device was used to
perform core needle biopsy of calcifications in the UPPER-OUTER
QUADRANT of the RIGHT breast using a LATERAL to MEDIAL approach.
Specimen radiograph was performed showing calcifications in numerous
tissue samples. Specimens with calcifications are identified for
pathology.

Lesion quadrant: UPPER-OUTER QUADRANT RIGHT

At the conclusion of the procedure, an X shaped tissue marker clip
was deployed into the biopsy cavity. Follow-up 2-view mammogram was
performed and dictated separately.
IMPRESSION: Stereotactic-guided biopsy of RIGHT breast calcifications. No
apparent complications.

ADDENDUM:
Pathology revealed SCLEROSING ADENOSIS AND FIBROCYSTIC CHANGES WITH
APOCRINE METAPLASIA, CALCIFICATIONS of the RIGHT breast, upper
outer, (x clip). This was found to be concordant by Dr. Gizellaa
Asrina.

Pathology results were discussed with the patient by telephone. The
patient reported doing well after the biopsy with tenderness and
bleeding at the site. Post biopsy instructions and care were
reviewed and questions were answered. The patient was encouraged to
call The [REDACTED] for any additional
concerns.

The patient was instructed to return for annual screening
mammography in May 2022.

Pathology results reported by Daniele Bp, RN on 06/19/2021.



Using sterile technique and 1% Lidocaine as local anesthetic, under
stereotactic guidance, a 9 gauge vacuum assisted device was used to
perform core needle biopsy of calcifications in the UPPER-OUTER
QUADRANT of the RIGHT breast using a LATERAL to MEDIAL approach.
Specimen radiograph was performed showing calcifications in numerous
tissue samples. Specimens with calcifications are identified for
pathology.

Lesion quadrant: UPPER-OUTER QUADRANT RIGHT

At the conclusion of the procedure, an X shaped tissue marker clip
was deployed into the biopsy cavity. Follow-up 2-view mammogram was
performed and dictated separately.
IMPRESSION: Stereotactic-guided biopsy of RIGHT breast calcifications. No
apparent complications.

## 2023-04-28 IMAGING — MG MM BREAST LOCALIZATION CLIP
4 series · 4 of 12 positions shown · non-contrast
Comparison: Previous exam(s).

CLINICAL DATA: Status post stereotactic guided core biopsy of
calcifications in the UPPER QUADRANT of the RIGHT breast.

EXAM:
3D DIAGNOSTIC RIGHT MAMMOGRAM POST STEREOTACTIC BIOPSY

[R LM synth-2D]
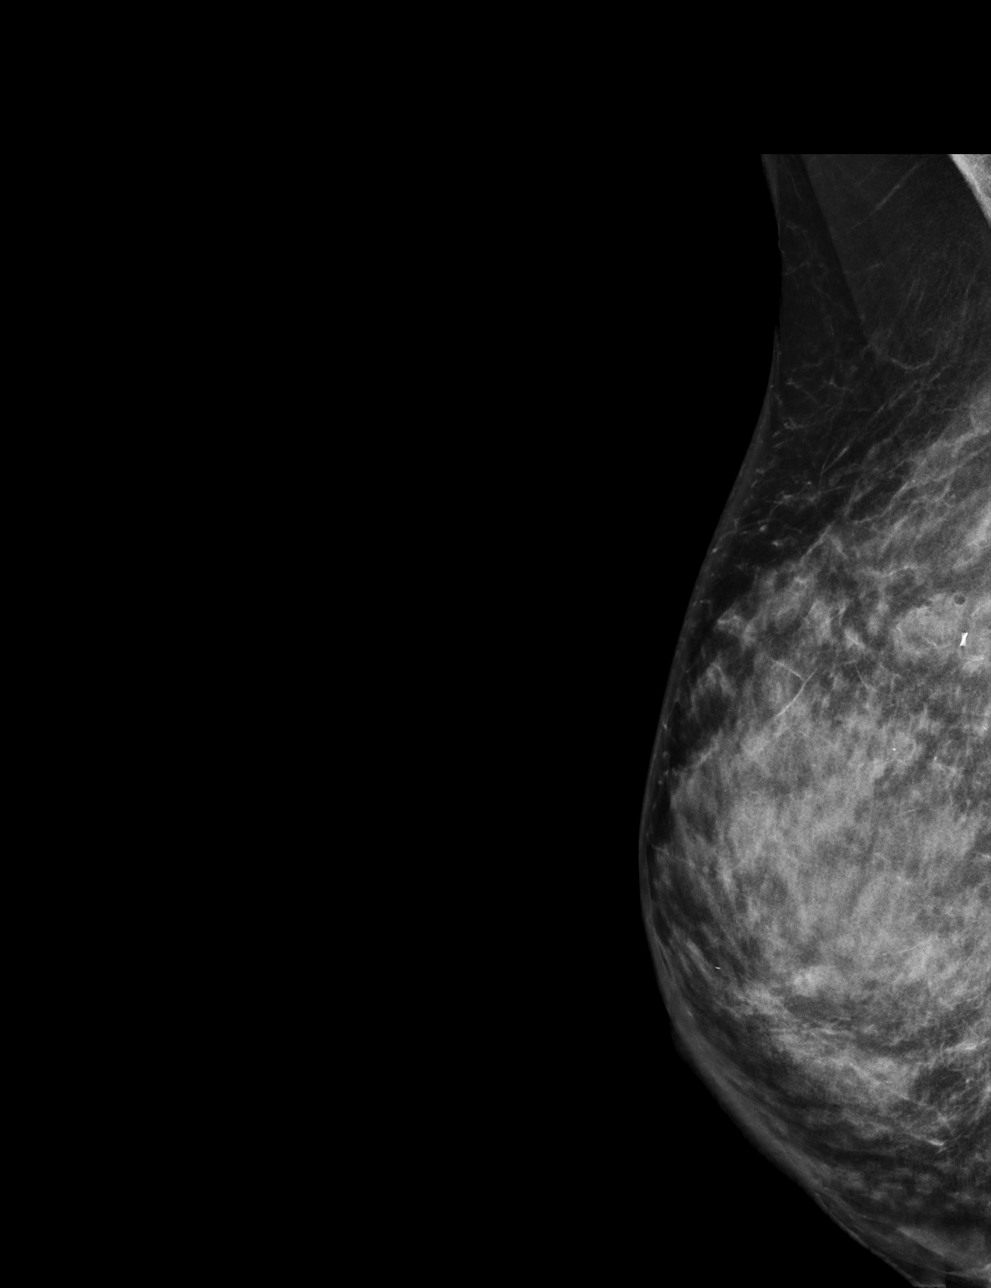

[R CC synth-2D]
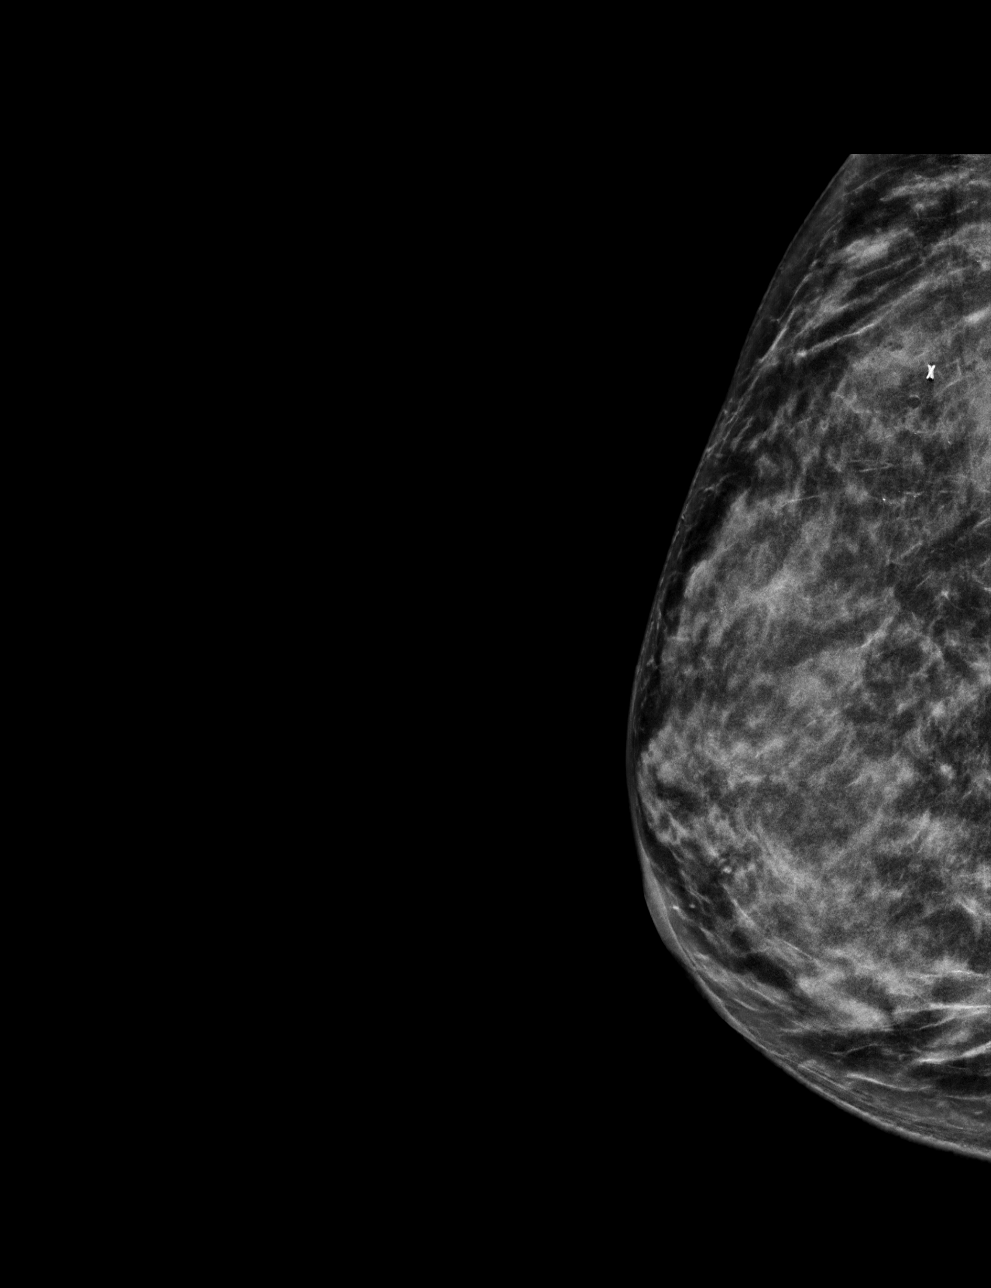

[R CC tomo · tomo slice 29/57.0]
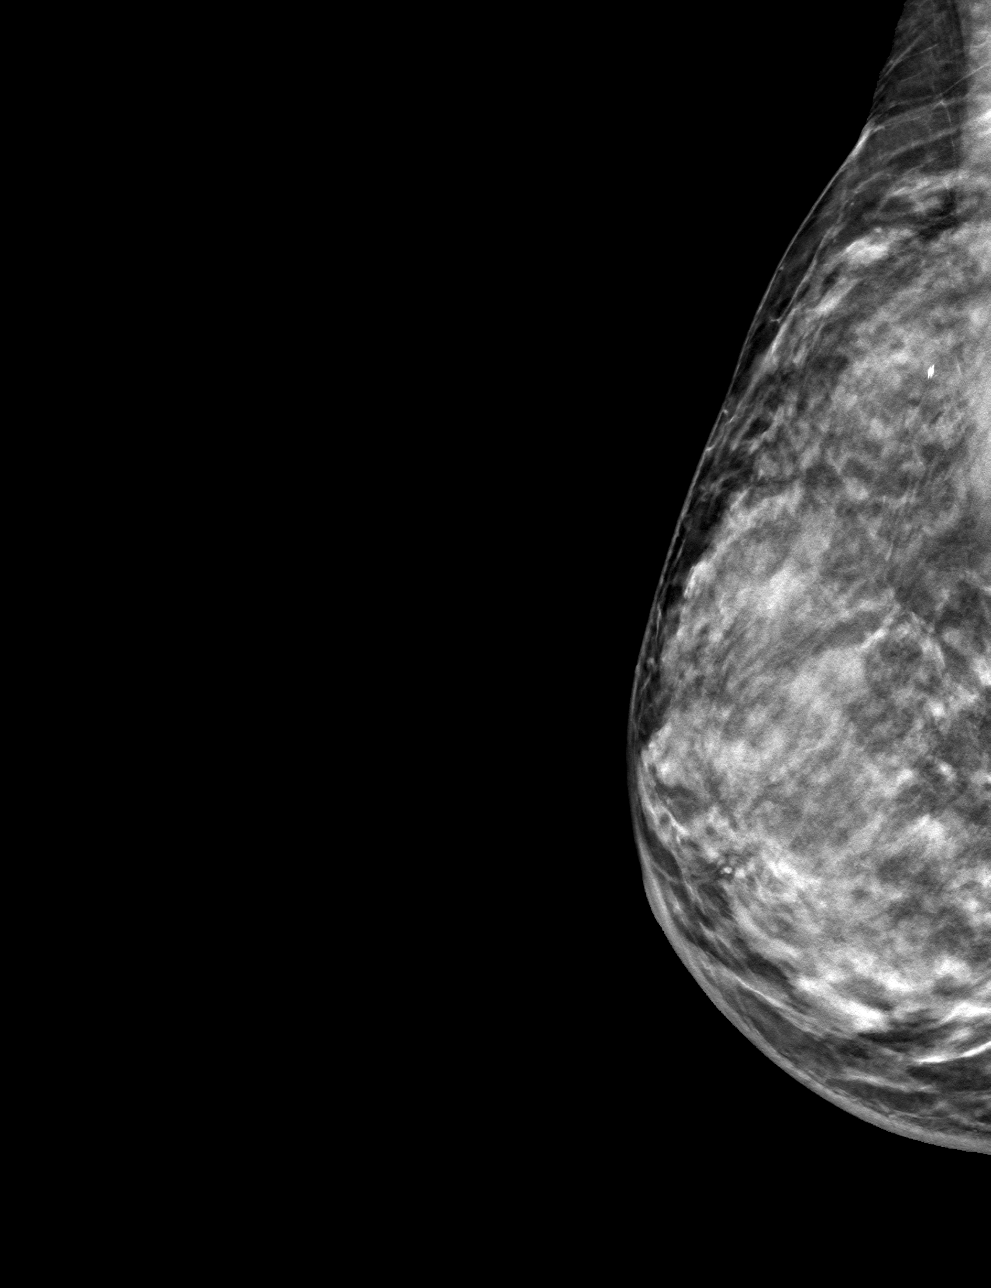

[R LM tomo · tomo slice 32/63.0]
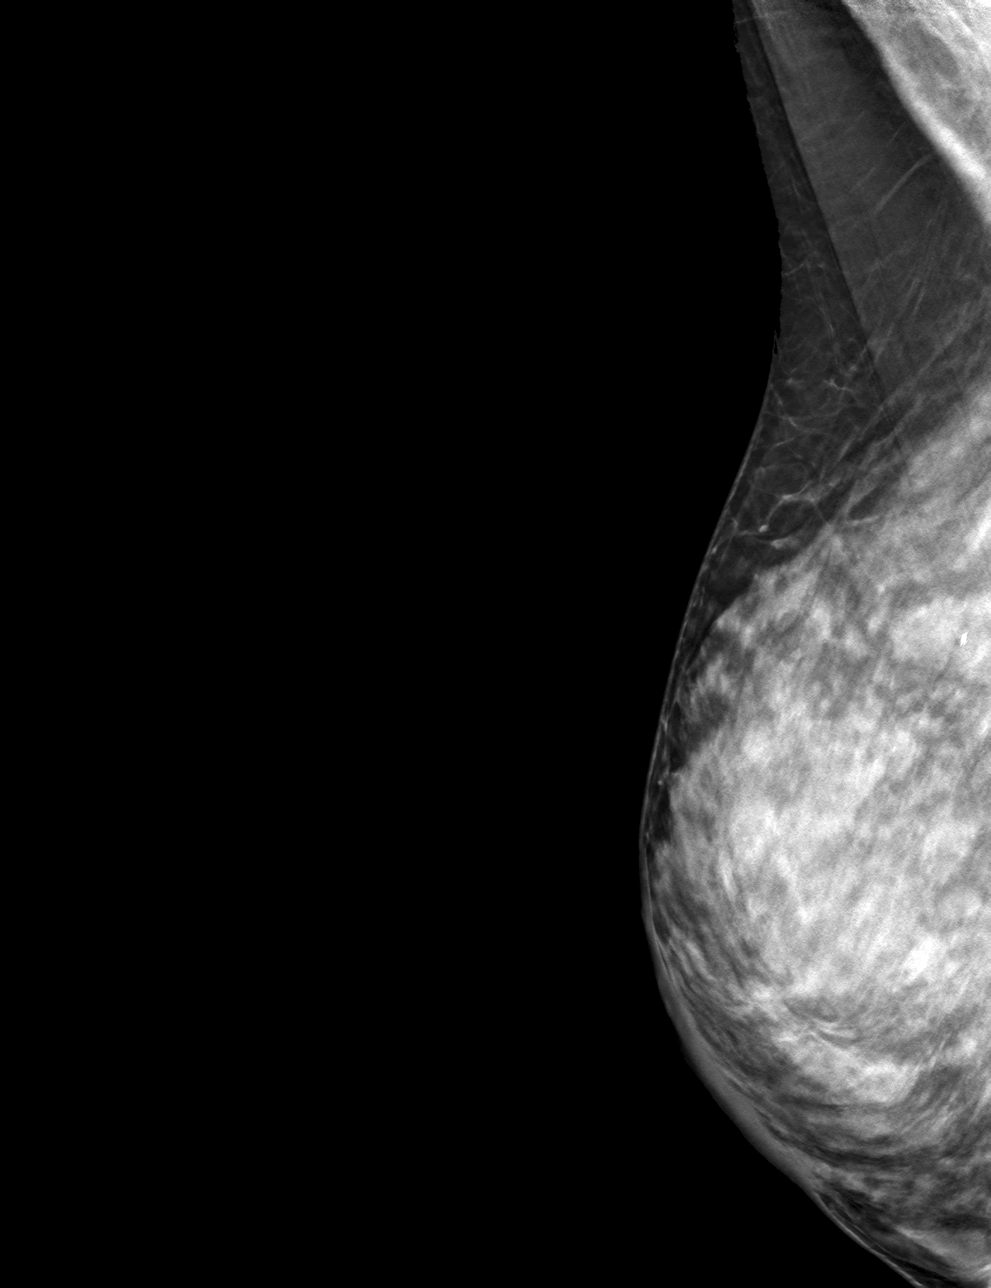

[4 of 12 positions shown; findings below may reference images not displayed]

FINDINGS: 3D Mammographic images were obtained following stereotactic guided
biopsy of calcifications in the UPPER-OUTER QUADRANT of the RIGHT
breast and placement of an X shaped clip. The biopsy marking clip is
in expected position at the site of biopsy.
IMPRESSION: Appropriate positioning of the X shaped biopsy marking clip at the
site of biopsy in the UPPER-OUTER QUADRANT of the RIGHT breast.

Final Assessment: Post Procedure Mammograms for Marker Placement

## 2023-08-13 DIAGNOSIS — K625 Hemorrhage of anus and rectum: Secondary | ICD-10-CM | POA: Diagnosis not present

## 2023-08-13 DIAGNOSIS — R197 Diarrhea, unspecified: Secondary | ICD-10-CM | POA: Diagnosis not present

## 2023-09-27 DIAGNOSIS — Z0000 Encounter for general adult medical examination without abnormal findings: Secondary | ICD-10-CM | POA: Diagnosis not present

## 2023-09-27 DIAGNOSIS — Z114 Encounter for screening for human immunodeficiency virus [HIV]: Secondary | ICD-10-CM | POA: Diagnosis not present

## 2023-09-27 DIAGNOSIS — Z13220 Encounter for screening for lipoid disorders: Secondary | ICD-10-CM | POA: Diagnosis not present

## 2023-10-04 DIAGNOSIS — Z0000 Encounter for general adult medical examination without abnormal findings: Secondary | ICD-10-CM | POA: Diagnosis not present

## 2023-11-10 DIAGNOSIS — S0502XA Injury of conjunctiva and corneal abrasion without foreign body, left eye, initial encounter: Secondary | ICD-10-CM | POA: Diagnosis not present

## 2024-02-25 DIAGNOSIS — Z6820 Body mass index (BMI) 20.0-20.9, adult: Secondary | ICD-10-CM | POA: Diagnosis not present

## 2024-02-25 DIAGNOSIS — Z124 Encounter for screening for malignant neoplasm of cervix: Secondary | ICD-10-CM | POA: Diagnosis not present

## 2024-02-25 DIAGNOSIS — Z01419 Encounter for gynecological examination (general) (routine) without abnormal findings: Secondary | ICD-10-CM | POA: Diagnosis not present

## 2024-02-25 DIAGNOSIS — Z1151 Encounter for screening for human papillomavirus (HPV): Secondary | ICD-10-CM | POA: Diagnosis not present

## 2024-03-26 ENCOUNTER — Other Ambulatory Visit: Payer: Self-pay | Admitting: Obstetrics and Gynecology

## 2024-03-26 DIAGNOSIS — Z1231 Encounter for screening mammogram for malignant neoplasm of breast: Secondary | ICD-10-CM

## 2024-03-27 ENCOUNTER — Ambulatory Visit
Admission: RE | Admit: 2024-03-27 | Discharge: 2024-03-27 | Disposition: A | Discharge status: Home / Self Care | Source: Ambulatory Visit | Attending: Obstetrics and Gynecology | Admitting: Obstetrics and Gynecology

## 2024-03-27 DIAGNOSIS — Z1231 Encounter for screening mammogram for malignant neoplasm of breast: Secondary | ICD-10-CM

## 2024-04-01 ENCOUNTER — Other Ambulatory Visit: Payer: Self-pay | Admitting: Obstetrics and Gynecology

## 2024-04-01 DIAGNOSIS — L820 Inflamed seborrheic keratosis: Secondary | ICD-10-CM | POA: Diagnosis not present

## 2024-04-01 DIAGNOSIS — D1801 Hemangioma of skin and subcutaneous tissue: Secondary | ICD-10-CM | POA: Diagnosis not present

## 2024-04-01 DIAGNOSIS — R928 Other abnormal and inconclusive findings on diagnostic imaging of breast: Secondary | ICD-10-CM

## 2024-04-01 DIAGNOSIS — L908 Other atrophic disorders of skin: Secondary | ICD-10-CM | POA: Diagnosis not present

## 2024-04-01 DIAGNOSIS — L814 Other melanin hyperpigmentation: Secondary | ICD-10-CM | POA: Diagnosis not present

## 2024-04-01 DIAGNOSIS — L821 Other seborrheic keratosis: Secondary | ICD-10-CM | POA: Diagnosis not present

## 2024-04-07 DIAGNOSIS — E280 Estrogen excess: Secondary | ICD-10-CM | POA: Diagnosis not present

## 2024-04-13 ENCOUNTER — Ambulatory Visit
Admission: RE | Admit: 2024-04-13 | Discharge: 2024-04-13 | Disposition: A | Discharge status: Home / Self Care | Source: Ambulatory Visit | Attending: Obstetrics and Gynecology | Admitting: Obstetrics and Gynecology

## 2024-04-13 ENCOUNTER — Other Ambulatory Visit: Payer: Self-pay | Admitting: Obstetrics and Gynecology

## 2024-04-13 DIAGNOSIS — N6002 Solitary cyst of left breast: Secondary | ICD-10-CM

## 2024-04-13 DIAGNOSIS — R928 Other abnormal and inconclusive findings on diagnostic imaging of breast: Secondary | ICD-10-CM

## 2024-04-13 DIAGNOSIS — N6012 Diffuse cystic mastopathy of left breast: Secondary | ICD-10-CM | POA: Diagnosis not present
